# Patient Record
Sex: Male | Born: 2000 | Race: Black or African American | Hispanic: No | Marital: Single | Smoking: Never smoker
Health system: Southern US, Community
[De-identification: ages and names within clinical notes are randomized; demographics above are authoritative.]

## PROBLEM LIST (undated history)

## (undated) HISTORY — PX: TONSILLECTOMY: SUR1361

---
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
## 2021-03-14 ENCOUNTER — Other Ambulatory Visit: Payer: Self-pay

## 2021-03-14 ENCOUNTER — Ambulatory Visit (INDEPENDENT_AMBULATORY_CARE_PROVIDER_SITE_OTHER): Payer: Medicaid Other

## 2021-03-14 ENCOUNTER — Encounter (HOSPITAL_COMMUNITY): Payer: Self-pay

## 2021-03-14 ENCOUNTER — Ambulatory Visit (HOSPITAL_COMMUNITY)
Admission: EM | Admit: 2021-03-14 | Discharge: 2021-03-14 | Disposition: A | Discharge status: Home / Self Care | Payer: Medicaid Other | Attending: Internal Medicine | Admitting: Internal Medicine

## 2021-03-14 DIAGNOSIS — M79644 Pain in right finger(s): Secondary | ICD-10-CM | POA: Diagnosis not present

## 2021-03-14 DIAGNOSIS — M7989 Other specified soft tissue disorders: Secondary | ICD-10-CM | POA: Diagnosis not present

## 2021-03-14 NOTE — ED Provider Notes (Signed)
[facility]    CSN: [id number] Arrival date & time: 03/14/21  1705      History   Chief Complaint Chief Complaint  Patient presents with  . finger swelling    HPI Tim Ho is a 20 y.o. male comes to the urgent care with right ring finger swelling of 2 years duration.  Patient says swelling has been persistent over the past couple of years but got worse over the past few days.  He denies any trauma to the right ring finger.  No redness.  No deformity except for the swelling of the right ring finger.  No numbness or tingling.  HPI  History reviewed. No pertinent past medical history.  There are no problems to display for this patient.   Past Surgical History:  Procedure Laterality Date  . TONSILLECTOMY         Home Medications    Prior to Admission medications   Medication Sig Start Date End Date Taking? Authorizing Provider  ibuprofen (ADVIL,MOTRIN) 600 MG tablet Take 1 tablet (600 mg total) by mouth every 6 (six) hours as needed. 03/07/14   Mellody Drown, PA-C  predniSONE (DELTASONE) 50 MG tablet 1 tab po qd x 3 more days 05/17/17   Viviano Simas, NP    Family History History reviewed. No pertinent family history.  Social History Social History   Tobacco Use  . Smoking status: Never Smoker  . Smokeless tobacco: Never Used  Substance Use Topics  . Alcohol use: No  . Drug use: Never     Allergies   Shellfish allergy   Review of Systems Review of Systems  Musculoskeletal: Negative.   Skin: Negative.      Physical Exam Triage Vital Signs ED Triage Vitals  Enc Vitals Group     BP 03/14/21 1726 (!) 120/58     Pulse Rate 03/14/21 1726 70     Resp 03/14/21 1726 18     Temp 03/14/21 1726 98.5 F (36.9 C)     Temp Source 03/14/21 1726 Oral     SpO2 03/14/21 1726 95 %     Weight --      Height --      Head Circumference --      Peak Flow --      Pain Score 03/14/21 1725 7     Pain Loc --      Pain Edu? --      Excl. in  GC? --    No data found.  Updated Vital Signs BP (!) 120/58 (BP Location: Left Arm)   Pulse 70   Temp 98.5 F (36.9 C) (Oral)   Resp 18   SpO2 95%   Visual Acuity Right Eye Distance:   Left Eye Distance:   Bilateral Distance:    Right Eye Near:   Left Eye Near:    Bilateral Near:     Physical Exam Vitals and nursing note reviewed.  Cardiovascular:     Rate and Rhythm: Normal rate and regular rhythm.  Musculoskeletal:        General: Swelling present. No tenderness, deformity or signs of injury. Normal range of motion.  Neurological:     Mental Status: He is alert.      UC Treatments / Results  Labs (all labs ordered are listed, but only abnormal results are displayed) Labs Reviewed - No data to display  EKG   Radiology DG Finger Ring Right  Result Date: 03/14/2021 CLINICAL DATA:  Right ring finger  swelling EXAM: RIGHT RING FINGER 2+V COMPARISON:  None. FINDINGS: Soft tissue swelling involving the right ring finger. No radiopaque foreign bodies. No acute bony abnormality. Specifically, no fracture, subluxation, or dislocation. Joint spaces maintained. IMPRESSION: No acute bony abnormality. Electronically Signed   By: Charlett Nose M.D.   On: 03/14/2021 18:13    Procedures Procedures (including critical care time)  Medications Ordered in UC Medications - No data to display  Initial Impression / Assessment and Plan / UC Course  I have reviewed the triage vital signs and the nursing notes.  Pertinent labs & imaging results that were available during my care of the patient were reviewed by me and considered in my medical decision making (see chart for details).     1.  Swelling of the right ring finger: Gentle range of motion exercises X-ray of the right ring finger is negative for fracture Follow-up with [facility] if swelling is persistent NSAIDs for pain. Final Clinical Impressions(s) / UC Diagnoses   Final diagnoses:  Swelling of right ring finger      Discharge Instructions     Gentle range of motion exercises NSAIDs as needed for pain If symptoms worsen please follow-up with your primary care physician or go to [facility] for further evaluation.   ED Prescriptions    None     PDMP not reviewed this encounter.   Merrilee Jansky, MD 03/14/21 Zollie Pee

## 2021-03-14 NOTE — ED Triage Notes (Signed)
Pt in with co right ring finger swelling that he noticed 2 years ago but has worsened over the last few days to the point where he cannot bend it completely    Pt has not had medication for sxs

## 2021-03-14 NOTE — Discharge Instructions (Signed)
Gentle range of motion exercises NSAIDs as needed for pain If symptoms worsen please follow-up with your primary care physician or go to [facility] for further evaluation.

## 2022-06-14 ENCOUNTER — Ambulatory Visit (HOSPITAL_COMMUNITY)
Admission: EM | Admit: 2022-06-14 | Discharge: 2022-06-14 | Disposition: A | Discharge status: Home / Self Care | Payer: Medicaid Other | Attending: Urgent Care | Admitting: Urgent Care

## 2022-06-14 ENCOUNTER — Encounter (HOSPITAL_COMMUNITY): Payer: Self-pay | Admitting: Emergency Medicine

## 2022-06-14 DIAGNOSIS — J069 Acute upper respiratory infection, unspecified: Secondary | ICD-10-CM

## 2022-06-14 MED ORDER — GUAIFENESIN ER 600 MG PO TB12: 600.0000 mg | ORAL_TABLET | Freq: Two times a day (BID) | ORAL | 0 refills | Status: DC | PRN

## 2022-06-14 NOTE — ED Provider Notes (Signed)
[facility]    CSN: [id number] Arrival date & time: 06/14/22  1131      History   Chief Complaint Chief Complaint  Patient presents with   Cough    HPI Tim Ho is a 21 y.o. male.   Pleasant 21 year old male with no known past medical history who takes no daily medications presents today due to concern of a cough that started 3 days ago.  He states at onset, he had tightness in his chest, heard some wheezing, and felt like he had some sneezing and runny nose.  Sneezing resolved after 24 hours.  He has been taking over-the-counter medication, and feels that his symptoms are improving, however his chest still is "a different breathing pattern".  He reports a tiny bit of tightness in his chest, but denies shortness of breath, chest pain, palpitations.  He is no longer having the wheezing.  He denies reproducible chest pain to palpation of his sternum.  He reports a mild degree of tenderness to his neck posteriorly.  He denies any fever or lymphadenopathy.  He primarily is requesting a note for work as he missed the past 3 days.   Cough   History reviewed. No pertinent past medical history.  There are no problems to display for this patient.   Past Surgical History:  Procedure Laterality Date   TONSILLECTOMY         Home Medications    Prior to Admission medications   Medication Sig Start Date End Date Taking? Authorizing Provider  guaiFENesin (MUCINEX) 600 MG 12 hr tablet Take 1 tablet (600 mg total) by mouth 2 (two) times daily as needed. 06/14/22  Yes Shavona Gunderman, Jodelle Gross, PA    Family History History reviewed. No pertinent family history.  Social History Social History   Tobacco Use   Smoking status: Never   Smokeless tobacco: Never  Substance Use Topics   Alcohol use: No   Drug use: Never     Allergies   Shellfish allergy   Review of Systems Review of Systems  Respiratory:  Positive for cough.   As per HPI   Physical Exam Triage  Vital Signs ED Triage Vitals  Enc Vitals Group     BP 06/14/22 1206 (!) 120/92     Pulse Rate 06/14/22 1206 78     Resp 06/14/22 1206 14     Temp 06/14/22 1206 97.6 F (36.4 C)     Temp src --      SpO2 06/14/22 1206 97 %     Weight --      Height --      Head Circumference --      Peak Flow --      Pain Score 06/14/22 1205 0     Pain Loc --      Pain Edu? --      Excl. in GC? --    No data found.  Updated Vital Signs BP (!) 120/92 (BP Location: Right Arm)   Pulse 78   Temp 97.6 F (36.4 C)   Resp 14   SpO2 97%   Visual Acuity Right Eye Distance:   Left Eye Distance:   Bilateral Distance:    Right Eye Near:   Left Eye Near:    Bilateral Near:     Physical Exam Vitals and nursing note reviewed. Exam conducted with a chaperone present.  Constitutional:      General: He is not in acute distress.    Appearance: Normal  appearance. He is well-developed and normal weight. He is not ill-appearing or toxic-appearing.  HENT:     Head: Normocephalic and atraumatic.     Right Ear: Tympanic membrane, ear canal and external ear normal. There is no impacted cerumen.     Left Ear: Tympanic membrane, ear canal and external ear normal. There is no impacted cerumen.     Nose: Nose normal. No congestion or rhinorrhea.     Mouth/Throat:     Mouth: Mucous membranes are moist.     Pharynx: Oropharynx is clear. No oropharyngeal exudate.  Eyes:     General: No scleral icterus.       Right eye: No discharge.        Left eye: No discharge.     Extraocular Movements: Extraocular movements intact.     Conjunctiva/sclera: Conjunctivae normal.     Pupils: Pupils are equal, round, and reactive to light.  Cardiovascular:     Rate and Rhythm: Normal rate and regular rhythm.     Pulses: Normal pulses.     Heart sounds: No murmur heard.    No gallop.  Pulmonary:     Effort: Pulmonary effort is normal. No respiratory distress.     Breath sounds: Normal breath sounds. No stridor. No  wheezing, rhonchi or rales.     Comments: No adventitious breath sounds, full respirations noted Chest:     Chest wall: No tenderness.  Abdominal:     Palpations: Abdomen is soft.     Tenderness: There is no abdominal tenderness.  Musculoskeletal:        General: No swelling.     Cervical back: Normal range of motion and neck supple. No rigidity or tenderness.  Lymphadenopathy:     Cervical: No cervical adenopathy.  Skin:    General: Skin is warm and dry.     Capillary Refill: Capillary refill takes less than 2 seconds.     Coloration: Skin is not jaundiced.     Findings: No bruising, erythema or rash.  Neurological:     General: No focal deficit present.     Mental Status: He is alert and oriented to person, place, and time.  Psychiatric:        Mood and Affect: Mood normal.      UC Treatments / Results  Labs (all labs ordered are listed, but only abnormal results are displayed) Labs Reviewed - No data to display  EKG   Radiology No results found.  Procedures Procedures (including critical care time)  Medications Ordered in UC Medications - No data to display  Initial Impression / Assessment and Plan / UC Course  I have reviewed the triage vital signs and the nursing notes.  Pertinent labs & imaging results that were available during my care of the patient were reviewed by me and considered in my medical decision making (see chart for details).     Viral URI with cough -patient's symptoms are improving.  Clinically, no worrisome findings on exam.  I feel that supportive measures are appropriate at this time.  We will send in Mucinex for patient.  Topical heat and he medication appropriate.  Patient cleared to return to work.  Final Clinical Impressions(s) / UC Diagnoses   Final diagnoses:  Viral URI with cough     Discharge Instructions      Your symptoms are consistent with a viral upper respiratory infection. I am glad you are starting to feel better.   You may take the Mucinex up  to twice daily to help break up any mucus that may be stuck in your chest. Steam from a hot shower and topical Vicks may also be beneficial. Using a microwavable heat pack to your chest or the back of your neck, or you can make 1 with a warm washcloth under hot water, would be beneficial to apply topically several times daily. You are cleared to return to work.      ED Prescriptions     Medication Sig Dispense Auth. Provider   guaiFENesin (MUCINEX) 600 MG 12 hr tablet Take 1 tablet (600 mg total) by mouth 2 (two) times daily as needed. 20 tablet Axil Copeman L, [place]      PDMP not reviewed this encounter.   Maretta Bees, [place] 06/14/22 1233

## 2022-06-14 NOTE — Discharge Instructions (Addendum)
Your symptoms are consistent with a viral upper respiratory infection. I am glad you are starting to feel better.  You may take the Mucinex up to twice daily to help break up any mucus that may be stuck in your chest. Steam from a hot shower and topical Vicks may also be beneficial. Using a microwavable heat pack to your chest or the back of your neck, or you can make 1 with a warm washcloth under hot water, would be beneficial to apply topically several times daily. You are cleared to return to work.

## 2022-06-14 NOTE — ED Triage Notes (Signed)
3 days ago had chest congestion, cough that had yellow congestion and felt tightness in chest when breathed and neck was painful. Reports called out of work past 3 days and needs to be seen. Took theraflu and cough is better now.

## 2023-07-07 ENCOUNTER — Ambulatory Visit (INDEPENDENT_AMBULATORY_CARE_PROVIDER_SITE_OTHER): Payer: Medicaid Other

## 2023-07-07 ENCOUNTER — Encounter (HOSPITAL_COMMUNITY): Payer: Self-pay | Admitting: *Deleted

## 2023-07-07 ENCOUNTER — Ambulatory Visit (HOSPITAL_COMMUNITY)
Admission: EM | Admit: 2023-07-07 | Discharge: 2023-07-07 | Disposition: A | Discharge status: Home / Self Care | Payer: Medicaid Other | Attending: Emergency Medicine | Admitting: Emergency Medicine

## 2023-07-07 DIAGNOSIS — R2231 Localized swelling, mass and lump, right upper limb: Secondary | ICD-10-CM

## 2023-07-07 NOTE — Discharge Instructions (Signed)
There is no bone involvement seen on X-ray. You can take Tylenol and Ibuprofen as needed for pain. Otherwise follow-up with [facility] for possible removal.

## 2023-07-07 NOTE — ED Provider Notes (Signed)
[facility]    CSN: [id number] Arrival date & time: 07/07/23  1335      History   Chief Complaint Chief Complaint  Patient presents with   finger problem    HPI Tim Ho is a 22 y.o. male.   Patient presents with two nodules to R ring finger for a few months. Patient states that someone diagnosed him with cysts awhile ago, but stated that they were bothersome enough at the time to have them removed. Patient endorses pain and trouble bending finger now and would like them removed.      History reviewed. No pertinent past medical history.  There are no problems to display for this patient.   Past Surgical History:  Procedure Laterality Date   TONSILLECTOMY         Home Medications    Prior to Admission medications   Not on File    Family History History reviewed. No pertinent family history.  Social History Social History   Tobacco Use   Smoking status: Never   Smokeless tobacco: Never  Substance Use Topics   Alcohol use: No   Drug use: Never     Allergies   Shellfish allergy   Review of Systems Review of Systems  Musculoskeletal:  Negative for joint swelling.  Skin:  Negative for color change and rash.     Physical Exam Triage Vital Signs ED Triage Vitals  Encounter Vitals Group     BP 07/07/23 1428 113/73     Systolic BP Percentile --      Diastolic BP Percentile --      Pulse Rate 07/07/23 1428 (!) 53     Resp 07/07/23 1428 18     Temp 07/07/23 1428 98 F (36.7 C)     Temp src --      SpO2 07/07/23 1428 97 %     Weight --      Height --      Head Circumference --      Peak Flow --      Pain Score 07/07/23 1426 8     Pain Loc --      Pain Education --      Exclude from Growth Chart --    No data found.  Updated Vital Signs BP 113/73   Pulse (!) 53   Temp 98 F (36.7 C)   Resp 18   SpO2 97%   Visual Acuity Right Eye Distance:   Left Eye Distance:   Bilateral Distance:    Right Eye Near:    Left Eye Near:    Bilateral Near:     Physical Exam Vitals and nursing note reviewed.  Constitutional:      General: He is not in acute distress.    Appearance: Normal appearance. He is not ill-appearing, toxic-appearing or diaphoretic.  Musculoskeletal:     Left hand: Deformity and tenderness present. No swelling or bony tenderness. Decreased range of motion.     Comments: Two small marble sized nodules to either side of proximal R ring finger. Mild tenderness upon palpation.   Skin:    General: Skin is warm and dry.  Neurological:     Mental Status: He is alert.      UC Treatments / Results  Labs (all labs ordered are listed, but only abnormal results are displayed) Labs Reviewed - No data to display  EKG   Radiology No results found.  Procedures Procedures (including critical care time)  Medications Ordered  in UC Medications - No data to display  Initial Impression / Assessment and Plan / UC Course  I have reviewed the triage vital signs and the nursing notes.  Pertinent labs & imaging results that were available during my care of the patient were reviewed by me and considered in my medical decision making (see chart for details).     Patient presented with 2 nodules to right ring finger for a few months.  Patient states that someone diagnosed him with cyst a while ago, but stated they were not bothersome enough at the time to have them removed.  Patient endorses pain and trouble bending finger and would like them removed.  2 small marble sized nodules to either side of proximal ring finger mild tenderness upon palpation.  X-ray ruled out any bony involvement.  Given orthopedic to follow-up with regarding removal.  Discussed follow-up and return precautions. Final Clinical Impressions(s) / UC Diagnoses   Final diagnoses:  Nodule of finger, right     Discharge Instructions      There is no bone involvement seen on X-ray. You can take Tylenol and Ibuprofen as  needed for pain. Otherwise follow-up with [facility] for possible removal.     ED Prescriptions   None    PDMP not reviewed this encounter.   Wynonia Lawman A, NP 07/07/23 [phone]

## 2023-07-07 NOTE — ED Triage Notes (Signed)
Pt has 2 bumps on his Rt middle finger. Pt reports he has a hard time bending his finger due to the bumps.  Pt needs a work note.

## 2023-09-03 ENCOUNTER — Ambulatory Visit (HOSPITAL_COMMUNITY)
Admission: EM | Admit: 2023-09-03 | Discharge: 2023-09-03 | Disposition: A | Discharge status: Home / Self Care | Payer: Medicaid Other

## 2023-09-03 ENCOUNTER — Encounter (HOSPITAL_COMMUNITY): Payer: Self-pay

## 2023-09-03 DIAGNOSIS — R053 Chronic cough: Secondary | ICD-10-CM

## 2023-09-03 MED ORDER — AZITHROMYCIN 250 MG PO TABS: ORAL_TABLET | ORAL | 0 refills | Status: AC

## 2023-09-03 MED ORDER — PREDNISONE 20 MG PO TABS: 40.0000 mg | ORAL_TABLET | Freq: Every day | ORAL | 0 refills | Status: AC

## 2023-09-03 MED ORDER — ALBUTEROL SULFATE HFA 108 (90 BASE) MCG/ACT IN AERS: 1.0000 | INHALATION_SPRAY | Freq: Four times a day (QID) | RESPIRATORY_TRACT | 0 refills | Status: AC | PRN

## 2023-09-03 MED ORDER — BENZONATATE 100 MG PO CAPS: 100.0000 mg | ORAL_CAPSULE | Freq: Three times a day (TID) | ORAL | 0 refills | Status: AC | PRN

## 2023-09-03 NOTE — ED Triage Notes (Signed)
Cough, Chest Tightness x2 weeks with  5/10 pain. No known sick exposure.   Patient tried otc nighttime cold medicine and Theraflu with no relief.

## 2023-09-03 NOTE — Discharge Instructions (Signed)
I have prescribed you 4 different medications for symptoms.  Please follow-up if symptoms persist or worsen.

## 2023-09-03 NOTE — ED Provider Notes (Signed)
[facility]    CSN: [id number] Arrival date & time: 09/03/23  1142      History   Chief Complaint Chief Complaint  Patient presents with   Cough    HPI Tim Ho is a 22 y.o. male.   Patient presents with approximately 2-week history of dry cough and feelings of chest tightness.  Denies any upper respiratory symptoms or fever.  Denies any known sick contacts.  Denies history of asthma.  Denies that he smokes cigarettes.  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  Has been taking TheraFlu with minimal improvement. Patient is not reporting any chest pain or shortness of breath.   Cough   History reviewed. No pertinent past medical history.  There are no problems to display for this patient.   Past Surgical History:  Procedure Laterality Date   TONSILLECTOMY         Home Medications    Prior to Admission medications   Medication Sig Start Date End Date Taking? Authorizing Provider  albuterol (VENTOLIN HFA) 108 (90 Base) MCG/ACT inhaler Inhale 1-2 puffs into the lungs every 6 (six) hours as needed for wheezing or shortness of breath. 09/03/23  Yes Shoshanah Dapper, Acie Fredrickson, FNP  azithromycin (ZITHROMAX) 250 MG tablet Take 2 tablets (500 mg total) by mouth daily for 1 day, THEN 1 tablet (250 mg total) daily for 4 days. Take first 2 tablets together, then 1 every day until finished.. 09/03/23 09/08/23 Yes Antawan Mchugh, Acie Fredrickson, FNP  benzonatate (TESSALON) 100 MG capsule Take 1 capsule (100 mg total) by mouth every 8 (eight) hours as needed for cough. 09/03/23  Yes Jareli Highland, Rolly Salter E, FNP  predniSONE (DELTASONE) 20 MG tablet Take 2 tablets (40 mg total) by mouth daily for 5 days. 09/03/23 09/08/23 Yes Gustavus Bryant, FNP  EPINEPHrine 0.3 mg/0.3 mL IJ SOAJ injection for anaphylaxis inject 0.3 milliliter (0.3 mg) as directed and call 911 12/30/21   [provider]    Family History History reviewed. No pertinent  family history.  Social History Social History   Tobacco Use   Smoking status: Never   Smokeless tobacco: Never  Vaping Use   Vaping status: Never Used  Substance Use Topics   Alcohol use: No   Drug use: Never     Allergies   Shellfish allergy   Review of Systems Review of Systems Per HPI  Physical Exam Triage Vital Signs ED Triage Vitals  Encounter Vitals Group     BP 09/03/23 1235 (!) 116/58     Systolic BP Percentile --      Diastolic BP Percentile --      Pulse Rate 09/03/23 1235 (!) 54     Resp 09/03/23 1235 18     Temp 09/03/23 1235 98 F (36.7 C)     Temp Source 09/03/23 1235 Oral     SpO2 09/03/23 1235 97 %     Weight 09/03/23 1235 235 lb (106.6 kg)     Height 09/03/23 1235 5\' 8"  (1.727 m)     Head Circumference --      Peak Flow --      Pain Score 09/03/23 1233 5     Pain Loc --      Pain Education --      Exclude from Growth Chart --    No data found.  Updated Vital Signs BP (!) 116/58 (BP Location: Right Arm)   Pulse (!) 54  Temp 98 F (36.7 C) (Oral)   Resp 18   Ht 5\' 8"  (1.727 m)   Wt 235 lb (106.6 kg)   SpO2 97%   BMI 35.73 kg/m   Visual Acuity Right Eye Distance:   Left Eye Distance:   Bilateral Distance:    Right Eye Near:   Left Eye Near:    Bilateral Near:     Physical Exam Constitutional:      General: He is not in acute distress.    Appearance: Normal appearance. He is not toxic-appearing or diaphoretic.  HENT:     Head: Normocephalic and atraumatic.     Right Ear: Tympanic membrane and ear canal normal.     Left Ear: Tympanic membrane and ear canal normal.     Nose: No congestion.     Mouth/Throat:     Mouth: Mucous membranes are moist.     Pharynx: No posterior oropharyngeal erythema.  Eyes:     Extraocular Movements: Extraocular movements intact.     Conjunctiva/sclera: Conjunctivae normal.     Pupils: Pupils are equal, round, and reactive to light.  Cardiovascular:     Rate and Rhythm: Normal rate and  regular rhythm.     Pulses: Normal pulses.     Heart sounds: Normal heart sounds.  Pulmonary:     Effort: Pulmonary effort is normal. No respiratory distress.     Breath sounds: Normal breath sounds. No stridor. No wheezing, rhonchi or rales.  Musculoskeletal:        General: Normal range of motion.     Cervical back: Normal range of motion.  Skin:    General: Skin is warm and dry.  Neurological:     General: No focal deficit present.     Mental Status: He is alert and oriented to person, place, and time. Mental status is at baseline.  Psychiatric:        Mood and Affect: Mood normal.        Behavior: Behavior normal.      UC Treatments / Results  Labs (all labs ordered are listed, but only abnormal results are displayed) Labs Reviewed - No data to display  EKG   Radiology No results found.  Procedures Procedures (including critical care time)  Medications Ordered in UC Medications - No data to display  Initial Impression / Assessment and Plan / UC Course  I have reviewed the triage vital signs and the nursing notes.  Pertinent labs & imaging results that were available during my care of the patient were reviewed by me and considered in my medical decision making (see chart for details).     Suspect postviral cough versus bronchitis.  Patient offered chest x-ray but declined this.  Will treat with azithromycin to cover for atypicals, prednisone to decrease inflammation, benzonatate for cough, and albuterol inhaler to take as needed for chest tightness.  There are no adventitious lung sounds on exam, no tachypnea, and oxygen is normal so do not think that emergent evaluation is necessary.  I have no concern for cardiac etiology on exam.  Advised patient to follow-up if any symptoms persist or worsen.  Patient verbalized understanding and was agreeable with plan. Final Clinical Impressions(s) / UC Diagnoses   Final diagnoses:  Persistent cough     Discharge  Instructions      I have prescribed you 4 different medications for symptoms.  Please follow-up if symptoms persist or worsen.    ED Prescriptions     Medication Sig  Dispense Auth. Provider   azithromycin (ZITHROMAX) 250 MG tablet Take 2 tablets (500 mg total) by mouth daily for 1 day, THEN 1 tablet (250 mg total) daily for 4 days. Take first 2 tablets together, then 1 every day until finished.. 6 tablet Larned, [place] E, [place]   benzonatate (TESSALON) 100 MG capsule Take 1 capsule (100 mg total) by mouth every 8 (eight) hours as needed for cough. 21 capsule Boaz, [place] E, [place]   predniSONE (DELTASONE) 20 MG tablet Take 2 tablets (40 mg total) by mouth daily for 5 days. 10 tablet [place], Rolly Salter E, [place]   albuterol (VENTOLIN HFA) 108 (90 Base) MCG/ACT inhaler Inhale 1-2 puffs into the lungs every 6 (six) hours as needed for wheezing or shortness of breath. 1 each Gustavus Bryant, [place]      PDMP not reviewed this encounter.   Gustavus Bryant, [place] 09/03/23 [phone]
